# Patient Record
Sex: Male | Born: 1940 | Race: White | Hispanic: No | Marital: Married | Smoking: Never smoker
Health system: Southern US, Community
[De-identification: ages and names within clinical notes are randomized; demographics above are authoritative.]

## PROBLEM LIST (undated history)

## (undated) DIAGNOSIS — N400 Enlarged prostate without lower urinary tract symptoms: Secondary | ICD-10-CM

## (undated) DIAGNOSIS — I10 Essential (primary) hypertension: Secondary | ICD-10-CM

## (undated) HISTORY — PX: SHOULDER ARTHROSCOPY: SHX128

---

## 2018-06-23 ENCOUNTER — Inpatient Hospital Stay (HOSPITAL_BASED_OUTPATIENT_CLINIC_OR_DEPARTMENT_OTHER)
Admission: EM | Admit: 2018-06-23 | Discharge: 2018-06-26 | DRG: 352 | Disposition: A | Discharge status: Home / Self Care | Payer: Medicare Other | Attending: General Surgery | Admitting: General Surgery

## 2018-06-23 ENCOUNTER — Emergency Department (HOSPITAL_BASED_OUTPATIENT_CLINIC_OR_DEPARTMENT_OTHER): Payer: Medicare Other

## 2018-06-23 ENCOUNTER — Inpatient Hospital Stay (HOSPITAL_COMMUNITY): Payer: Medicare Other

## 2018-06-23 ENCOUNTER — Other Ambulatory Visit: Payer: Self-pay

## 2018-06-23 ENCOUNTER — Encounter (HOSPITAL_BASED_OUTPATIENT_CLINIC_OR_DEPARTMENT_OTHER): Payer: Self-pay | Admitting: *Deleted

## 2018-06-23 DIAGNOSIS — N5089 Other specified disorders of the male genital organs: Secondary | ICD-10-CM | POA: Diagnosis present

## 2018-06-23 DIAGNOSIS — Z880 Allergy status to penicillin: Secondary | ICD-10-CM

## 2018-06-23 DIAGNOSIS — Z0189 Encounter for other specified special examinations: Secondary | ICD-10-CM

## 2018-06-23 DIAGNOSIS — N400 Enlarged prostate without lower urinary tract symptoms: Secondary | ICD-10-CM | POA: Diagnosis present

## 2018-06-23 DIAGNOSIS — Z79899 Other long term (current) drug therapy: Secondary | ICD-10-CM | POA: Diagnosis not present

## 2018-06-23 DIAGNOSIS — I10 Essential (primary) hypertension: Secondary | ICD-10-CM | POA: Diagnosis present

## 2018-06-23 DIAGNOSIS — K56609 Unspecified intestinal obstruction, unspecified as to partial versus complete obstruction: Secondary | ICD-10-CM

## 2018-06-23 DIAGNOSIS — K4030 Unilateral inguinal hernia, with obstruction, without gangrene, not specified as recurrent: Secondary | ICD-10-CM | POA: Diagnosis present

## 2018-06-23 HISTORY — DX: Benign prostatic hyperplasia without lower urinary tract symptoms: N40.0

## 2018-06-23 HISTORY — DX: Essential (primary) hypertension: I10

## 2018-06-23 LAB — COMPREHENSIVE METABOLIC PANEL
ALK PHOS: 57 U/L (ref 38–126)
ALT: 22 U/L (ref 0–44)
ANION GAP: 7 (ref 5–15)
AST: 21 U/L (ref 15–41)
Albumin: 4 g/dL (ref 3.5–5.0)
BUN: 21 mg/dL (ref 8–23)
CALCIUM: 8.6 mg/dL — AB (ref 8.9–10.3)
CO2: 25 mmol/L (ref 22–32)
Chloride: 104 mmol/L (ref 98–111)
Creatinine, Ser: 0.93 mg/dL (ref 0.61–1.24)
GFR calc non Af Amer: >60 mL/min (ref >60)
Glucose, Bld: 138 mg/dL — ABNORMAL HIGH (ref 70–99)
Potassium: 4 mmol/L (ref 3.5–5.1)
SODIUM: 136 mmol/L (ref 135–145)
Total Bilirubin: 1.6 mg/dL — ABNORMAL HIGH (ref 0.3–1.2)
Total Protein: 6.4 g/dL — ABNORMAL LOW (ref 6.5–8.1)

## 2018-06-23 LAB — URINALYSIS, MICROSCOPIC (REFLEX)

## 2018-06-23 LAB — CBC WITH DIFFERENTIAL/PLATELET
BASOS PCT: 0 %
Basophils Absolute: 0 10*3/uL (ref 0.0–0.1)
Eosinophils Absolute: 0 10*3/uL (ref 0.0–0.7)
Eosinophils Relative: 0 %
HCT: 44.1 % (ref 39.0–52.0)
HEMOGLOBIN: 15.9 g/dL (ref 13.0–17.0)
Lymphocytes Relative: 9 %
Lymphs Abs: 1 10*3/uL (ref 0.7–4.0)
MCH: 32.6 pg (ref 26.0–34.0)
MCHC: 36.1 g/dL — AB (ref 30.0–36.0)
MCV: 90.4 fL (ref 78.0–100.0)
MONOS PCT: 5 %
Monocytes Absolute: 0.5 10*3/uL (ref 0.1–1.0)
NEUTROS ABS: 9.4 10*3/uL — AB (ref 1.7–7.7)
NEUTROS PCT: 86 %
Platelets: 163 10*3/uL (ref 150–400)
RBC: 4.88 MIL/uL (ref 4.22–5.81)
RDW: 13 % (ref 11.5–15.5)
WBC: 11 10*3/uL — ABNORMAL HIGH (ref 4.0–10.5)

## 2018-06-23 LAB — URINALYSIS, ROUTINE W REFLEX MICROSCOPIC
Bilirubin Urine: NEGATIVE
Glucose, UA: NEGATIVE mg/dL
Ketones, ur: 15 mg/dL — AB
Leukocytes, UA: NEGATIVE
NITRITE: NEGATIVE
PH: 5.5 (ref 5.0–8.0)
Protein, ur: NEGATIVE mg/dL

## 2018-06-23 LAB — LIPASE, BLOOD: LIPASE: 22 U/L (ref 11–51)

## 2018-06-23 MED ORDER — LIDOCAINE HCL URETHRAL/MUCOSAL 2 % EX GEL
CUTANEOUS | Status: AC
  Filled 2018-06-23: qty 20

## 2018-06-23 MED ORDER — ONDANSETRON HCL 4 MG/2ML IJ SOLN
4.0000 mg | Freq: Four times a day (QID) | INTRAMUSCULAR | Status: DC | PRN
  Administered 2018-06-23 – 2018-06-25 (×2): 4 mg via INTRAVENOUS
  Filled 2018-06-23 (×2): qty 2

## 2018-06-23 MED ORDER — MORPHINE SULFATE (PF) 2 MG/ML IV SOLN
2.0000 mg | INTRAVENOUS | Status: DC | PRN
  Administered 2018-06-23 (×3): 2 mg via INTRAVENOUS
  Filled 2018-06-23 (×3): qty 1

## 2018-06-23 MED ORDER — LIDOCAINE HCL URETHRAL/MUCOSAL 2 % EX GEL: 1.0000 "application " | Freq: Once | CUTANEOUS | Status: DC

## 2018-06-23 MED ORDER — IOPAMIDOL (ISOVUE-300) INJECTION 61%
100.0000 mL | Freq: Once | INTRAVENOUS | Status: AC
  Administered 2018-06-23: 100 mL via INTRAVENOUS

## 2018-06-23 MED ORDER — ONDANSETRON HCL 4 MG/2ML IJ SOLN
4.0000 mg | Freq: Once | INTRAMUSCULAR | Status: AC
  Administered 2018-06-23: 4 mg via INTRAVENOUS
  Filled 2018-06-23: qty 2

## 2018-06-23 MED ORDER — MORPHINE SULFATE (PF) 4 MG/ML IV SOLN
4.0000 mg | Freq: Once | INTRAVENOUS | Status: AC
  Administered 2018-06-23: 4 mg via INTRAVENOUS
  Filled 2018-06-23: qty 1

## 2018-06-23 MED ORDER — ONDANSETRON HCL 4 MG/2ML IJ SOLN
4.0000 mg | Freq: Four times a day (QID) | INTRAMUSCULAR | Status: DC
  Administered 2018-06-23 (×2): 4 mg via INTRAVENOUS
  Filled 2018-06-23 (×2): qty 2

## 2018-06-23 MED ORDER — ONDANSETRON 4 MG PO TBDP: 4.0000 mg | ORAL_TABLET | Freq: Once | ORAL | Status: DC

## 2018-06-23 MED ORDER — SODIUM CHLORIDE 0.9 % IV BOLUS
1000.0000 mL | Freq: Once | INTRAVENOUS | Status: AC
  Administered 2018-06-23: 1000 mL via INTRAVENOUS

## 2018-06-23 MED ORDER — DIATRIZOATE MEGLUMINE & SODIUM 66-10 % PO SOLN
90.0000 mL | Freq: Once | ORAL | Status: AC
  Administered 2018-06-23: 90 mL via NASOGASTRIC
  Filled 2018-06-23: qty 90

## 2018-06-23 MED ORDER — ENOXAPARIN SODIUM 40 MG/0.4ML ~~LOC~~ SOLN
40.0000 mg | SUBCUTANEOUS | Status: DC
  Administered 2018-06-24: 40 mg via SUBCUTANEOUS
  Filled 2018-06-23: qty 0.4

## 2018-06-23 MED ORDER — POTASSIUM CHLORIDE IN NACL 20-0.9 MEQ/L-% IV SOLN
INTRAVENOUS | Status: DC
  Administered 2018-06-23 – 2018-06-25 (×5): via INTRAVENOUS
  Filled 2018-06-23 (×8): qty 1000

## 2018-06-23 NOTE — ED Provider Notes (Signed)
[facility] EMERGENCY DEPARTMENT Provider Note   CSN: [id number] Arrival date & time: 06/23/18  0117     History   Chief Complaint Chief Complaint  Patient presents with  . Abdominal Pain    HPI Journey Ratterman is a 77 y.o. male.  Patient is a 77 year old male with past medical history of hypertension and BPH.  He presents today for evaluation of abdominal pain.  Earlier this evening he began with discomfort in the umbilical region.  He felt he might be constipated and tried taking some laxatives with no relief.  He did experience one episode of vomiting this evening, but no diarrhea.  He denies any fevers or chills.  He also complains of a swollen right testicle which she just noticed earlier this evening.  The history is provided by the patient.  Abdominal Pain   This is a new problem. Episode onset: this evening. The problem occurs constantly. The problem has been rapidly worsening. The pain is associated with an unknown factor. The pain is located in the periumbilical region. The pain is moderate. Associated symptoms include nausea and vomiting. Pertinent negatives include fever and diarrhea. The symptoms are aggravated by certain positions and palpation. Nothing relieves the symptoms.    Past Medical History:  Diagnosis Date  . Hypertension    borderline   . Prostate enlargement     There are no active problems to display for this patient.   Past Surgical History:  Procedure Laterality Date  . SHOULDER ARTHROSCOPY          Home Medications    Prior to Admission medications   Medication Sig Start Date End Date Taking? Authorizing Provider  dutasteride (AVODART) 0.5 MG capsule Take 0.5 mg by mouth daily.   Yes [provider]  glucosamine-chondroitin 500-400 MG tablet Take 1 tablet by mouth 3 (three) times daily.   Yes [provider]  tamsulosin (FLOMAX) 0.4 MG CAPS capsule Take 0.4 mg by mouth.   Yes [provider]    TraZODone HCl 150 MG TB24 Take by mouth.   Yes [provider]    Family History No family history on file.  Social History Social History   Tobacco Use  . Smoking status: Never Smoker  . Smokeless tobacco: Never Used  Substance Use Topics  . Alcohol use: Yes    Comment: occasional   . Drug use: Not on file     Allergies   Penicillins   Review of Systems Review of Systems  Constitutional: Negative for fever.  Gastrointestinal: Positive for abdominal pain, nausea and vomiting. Negative for diarrhea.  All other systems reviewed and are negative.    Physical Exam Updated Vital Signs BP (!) 166/82   Pulse 72   Temp 98.4 F (36.9 C)   Resp 20   Ht 5\' 10"  (1.778 m)   Wt 72.1 kg   SpO2 100%   BMI 22.81 kg/m   Physical Exam  Constitutional: He is oriented to person, place, and time. He appears well-developed and well-nourished. No distress.  HENT:  Head: Normocephalic and atraumatic.  Mouth/Throat: Oropharynx is clear and moist.  Neck: Normal range of motion. Neck supple.  Cardiovascular: Normal rate and regular rhythm. Exam reveals no friction rub.  No murmur heard. Pulmonary/Chest: Effort normal and breath sounds normal. No respiratory distress. He has no wheezes. He has no rales.  Abdominal: Soft. Bowel sounds are normal. He exhibits no distension. There is tenderness in the periumbilical area. There is no  rigidity, no rebound and no guarding.  Genitourinary: Right testis shows swelling.  Genitourinary Comments: The right testicle is swollen, but freely movable within the scrotum.  There is minimal tenderness to palpation.  Musculoskeletal: Normal range of motion. He exhibits no edema.  Neurological: He is alert and oriented to person, place, and time. Coordination normal.  Skin: Skin is warm and dry. He is not diaphoretic.  Nursing note and vitals reviewed.    ED Treatments / Results  Labs (all labs ordered are listed, but only abnormal results  are displayed) Labs Reviewed  LIPASE, BLOOD  COMPREHENSIVE METABOLIC PANEL  CBC WITH DIFFERENTIAL/PLATELET  URINALYSIS, ROUTINE W REFLEX MICROSCOPIC    EKG None  Radiology No results found.  Procedures Procedures (including critical care time)  Medications Ordered in ED Medications  sodium chloride 0.9 % bolus 1,000 mL (has no administration in time range)  ondansetron (ZOFRAN) injection 4 mg (has no administration in time range)     Initial Impression / Assessment and Plan / ED Course  I have reviewed the triage vital signs and the nursing notes.  Pertinent labs & imaging results that were available during my care of the patient were reviewed by me and considered in my medical decision making (see chart for details).  Patient's work-up reveals a small bowel obstruction.  I have discussed this with Dr. Johna SheriffHoxworth from general surgery.  He agrees to accept the patient in transfer.  He will go [facility] ER for surgical consultation.  An NG tube will be placed.  Final Clinical Impressions(s) / ED Diagnoses   Final diagnoses:  None    ED Discharge Orders    None       Geoffery Lyonselo, Dannah Ryles, MD 06/23/18 [phone]

## 2018-06-23 NOTE — Progress Notes (Signed)
Elevated b/p noted- pt is having increased pain at the moment, rechecked manually 184/82

## 2018-06-23 NOTE — Progress Notes (Signed)
Pt arrived to 1511 via [facility]. No c/o pain or nausea. NGT patent and on low intermittent wall suction. Pt orient to room. No questions. VSS. MD paged of pt arrival and orders placed. Justin Mendaudle, Al Bracewell H, RN

## 2018-06-23 NOTE — H&P (Signed)
[facility] Surgery Consult/Admission Note  Kenneth Barnett July 25, 1941  [id number].    Requesting MD: Dr. Stark Jock Chief Complaint/Reason for Consult: SBO  HPI:   Patient is a 77 year old male with a history of enlarged prostate who presented to the emergency department with complaints of abdominal pain, nausea and vomiting. Patient states he is in town shopping for furniture. He does not live here. He states yesterday afternoon he was shopping with his wife and began having diaphoresis, abdominal pain, and nausea. He does not believe he had a bowel movement on Saturday or Sunday morning and he is usually regular every day. His abdominal pain was intermittent throughout the day, crampy in nature, severe at times, and at times radiated into his scrotum. Patient tried stool softener and a suppository and had a small BM last night. Pain progressively worsened which prompted him to come to the ER. Patient states he noticed swelling in his right scrotum last night which has since resolved. He does not recall having issues with scrotal swelling in the past. He thinks he may have had a hernia in high school/college. He has a hx of a right testicular cyst but has not had any issues with swelling prior to last night. Patient states 2 episodes of emesis. No additional flatus or BM since arrival to the hospital. Patient denies any previous abdominal surgeries. Patient is not anticoagulated.  ROS:  Review of Systems  Constitutional: Positive for diaphoresis. Negative for chills and fever.  HENT: Negative for sore throat.   Respiratory: Negative for cough and shortness of breath.   Cardiovascular: Negative for chest pain.  Gastrointestinal: Positive for abdominal pain, constipation, nausea and vomiting. Negative for blood in stool and diarrhea.  Genitourinary: Negative for dysuria.       + for R scrotal swelling  Skin: Negative for rash.  Neurological: Positive for dizziness. Negative for sensory change,  focal weakness and loss of consciousness.  All other systems reviewed and are negative.    No family history on file.  Past Medical History:  Diagnosis Date  . Hypertension    borderline   . Prostate enlargement     Past Surgical History:  Procedure Laterality Date  . SHOULDER ARTHROSCOPY      Social History:  reports that he has never smoked. He has never used smokeless tobacco. He reports that he drinks alcohol. His drug history is not on file.  Allergies:  Allergies  Allergen Reactions  . Penicillins Rash    Has patient had a PCN reaction causing immediate rash, facial/tongue/throat swelling, SOB or lightheadedness with hypotension: Yes Has patient had a PCN reaction causing severe rash involving mucus membranes or skin necrosis: No Has patient had a PCN reaction that required hospitalization: No Has patient had a PCN reaction occurring within the last 10 years: No If all of the above answers are "NO", then may proceed with Cephalosporin use.  . Tape Rash    Paper tape is ok    Medications Prior to Admission  Medication Sig Dispense Refill  . Cyanocobalamin (VITAMIN B-12) 1000 MCG SUBL Place 1,000 mcg under the tongue daily.    Marland Kitchen dutasteride (AVODART) 0.5 MG capsule Take 0.5 mg by mouth daily.    Marland Kitchen GLUCOSAMINE-CHONDROITIN PO Take 1 tablet by mouth daily. Glucosamine 1500 mg-Chondroitin 1200 mg tablet    . Probiotic Product (PROBIOTIC PO) Take 1 capsule by mouth daily.    . tamsulosin (FLOMAX) 0.4 MG CAPS capsule Take 0.4 mg by mouth daily.     Marland Kitchen  TraZODone HCl 150 MG TB24 Take 150 mg by mouth at bedtime.     . TURMERIC PO Place 500 mg under the tongue daily.      Blood pressure (!) 159/77, pulse 66, temperature 98.6 F (37 C), temperature source Oral, resp. rate 20, height '5\' 10"'$  (1.778 m), weight 72.1 kg, SpO2 98 %.  Physical Exam  Constitutional: He is oriented to person, place, and time. He appears well-developed and well-nourished.  Non-toxic appearance. He does  not appear ill. No distress.  Appears younger than stated age  HENT:  Head: Normocephalic and atraumatic.  Nose: Nose normal.  Mouth/Throat: Oropharynx is clear and moist and mucous membranes are normal.  Eyes: Pupils are equal, round, and reactive to light. Conjunctivae are normal. Right eye exhibits no discharge. Left eye exhibits no discharge. No scleral icterus.  Neck: Normal range of motion. Neck supple. No thyromegaly present.  Cardiovascular: Normal rate, regular rhythm, normal heart sounds and intact distal pulses.  No murmur heard. Pulses:      Dorsalis pedis pulses are 2+ on the right side, and 2+ on the left side.  Pulmonary/Chest: Effort normal and breath sounds normal. No respiratory distress. He has no wheezes. He has no rhonchi. He has no rales.  Abdominal: Soft. Normal appearance and bowel sounds are normal. He exhibits no distension. There is no hepatosplenomegaly. There is tenderness in the periumbilical area. There is no rigidity and no guarding.    Genitourinary: Penis normal. Right testis shows mass (pt states is a cyst). Right testis shows no swelling and no tenderness. Left testis shows no mass, no swelling and no tenderness.  Genitourinary Comments: Did not appreciate a hernia  Musculoskeletal: Normal range of motion. He exhibits no edema, tenderness or deformity.  Lymphadenopathy:    He has no cervical adenopathy.  Neurological: He is alert and oriented to person, place, and time.  Skin: Skin is warm and dry. No rash noted. He is not diaphoretic.  Psychiatric: He has a normal mood and affect.  Nursing note and vitals reviewed.   Results for orders placed or performed during the hospital encounter of 06/23/18 (from the past 48 hour(s))  Lipase, blood     Status: None   Collection Time: 06/23/18  2:25 AM  Result Value Ref Range   Lipase 22 11 - 51 U/L    Comment: Performed at [facility], [address] [place], [place] [address]  Comprehensive  metabolic panel     Status: Abnormal   Collection Time: 06/23/18  2:25 AM  Result Value Ref Range   Sodium 136 135 - 145 mmol/L   Potassium 4.0 3.5 - 5.1 mmol/L   Chloride 104 98 - 111 mmol/L   CO2 25 22 - 32 mmol/L   Glucose, Bld 138 (H) 70 - 99 mg/dL   BUN 21 8 - 23 mg/dL   Creatinine, Ser 0.93 0.61 - 1.24 mg/dL   Calcium 8.6 (L) 8.9 - 10.3 mg/dL   Total Protein 6.4 (L) 6.5 - 8.1 g/dL   Albumin 4.0 3.5 - 5.0 g/dL   AST 21 15 - 41 U/L   ALT 22 0 - 44 U/L   Alkaline Phosphatase 57 38 - 126 U/L   Total Bilirubin 1.6 (H) 0.3 - 1.2 mg/dL   GFR calc non Af Amer >60 >60 mL/min   GFR calc Af Amer >60 >60 mL/min    Comment: (NOTE) The eGFR has been calculated using the CKD EPI equation. This calculation  has not been validated in all clinical situations. eGFR's persistently <60 mL/min signify possible Chronic Kidney Disease.    Anion gap 7 5 - 15    Comment: Performed at [facility], [address] [place], [place] [address]  CBC with Differential     Status: Abnormal   Collection Time: 06/23/18  2:25 AM  Result Value Ref Range   WBC 11.0 (H) 4.0 - 10.5 K/uL   RBC 4.88 4.22 - 5.81 MIL/uL   Hemoglobin 15.9 13.0 - 17.0 g/dL   HCT 44.1 39.0 - 52.0 %   MCV 90.4 78.0 - 100.0 fL   MCH 32.6 26.0 - 34.0 pg   MCHC 36.1 (H) 30.0 - 36.0 g/dL   RDW 13.0 11.5 - 15.5 %   Platelets 163 150 - 400 K/uL   Neutrophils Relative % 86 %   Neutro Abs 9.4 (H) 1.7 - 7.7 K/uL   Lymphocytes Relative 9 %   Lymphs Abs 1.0 0.7 - 4.0 K/uL   Monocytes Relative 5 %   Monocytes Absolute 0.5 0.1 - 1.0 K/uL   Eosinophils Relative 0 %   Eosinophils Absolute 0.0 0.0 - 0.7 K/uL   Basophils Relative 0 %   Basophils Absolute 0.0 0.0 - 0.1 K/uL    Comment: Performed at [facility], [address] [place], [place] [address]  Urinalysis, Routine w reflex microscopic     Status: Abnormal   Collection Time: 06/23/18  2:25 AM  Result Value Ref Range   Color, Urine YELLOW YELLOW   APPearance  CLEAR CLEAR   Specific Gravity, Urine >1.030 (H) 1.005 - 1.030   pH 5.5 5.0 - 8.0   Glucose, UA NEGATIVE NEGATIVE mg/dL   Hgb urine dipstick TRACE (A) NEGATIVE   Bilirubin Urine NEGATIVE NEGATIVE   Ketones, ur 15 (A) NEGATIVE mg/dL   Protein, ur NEGATIVE NEGATIVE mg/dL   Nitrite NEGATIVE NEGATIVE   Leukocytes, UA NEGATIVE NEGATIVE    Comment: Performed at [facility], [address] [place], [place] [address]  Urinalysis, Microscopic (reflex)     Status: Abnormal   Collection Time: 06/23/18  2:25 AM  Result Value Ref Range   RBC / HPF 0-5 0 - 5 RBC/hpf   WBC, UA 0-5 0 - 5 WBC/hpf   Bacteria, UA FEW (A) NONE SEEN   Squamous Epithelial / LPF 0-5 0 - 5   Mucus PRESENT    Sperm, UA PRESENT     Comment: Performed at [facility], [address] [place], [place] [address]   Ct Abdomen Pelvis W Contrast  Result Date: 06/23/2018 CLINICAL DATA:  77 y/o M; nausea, vomiting, bloating, constipation for 1 day. EXAM: CT ABDOMEN AND PELVIS WITH CONTRAST TECHNIQUE: Multidetector CT imaging of the abdomen and pelvis was performed using the standard protocol following bolus administration of intravenous contrast. CONTRAST:  141m ISOVUE-300 IOPAMIDOL (ISOVUE-300) INJECTION 61% COMPARISON:  None. FINDINGS: Lower chest: No acute abnormality. Hepatobiliary: No focal liver abnormality is seen. No gallstones, gallbladder wall thickening, or biliary dilatation. Pancreas: Unremarkable. No pancreatic ductal dilatation or surrounding inflammatory changes. Spleen: Normal in size without focal abnormality. Adrenals/Urinary Tract: Adrenal glands are unremarkable. Right kidney interpolar subcentimeter cyst. Kidneys are otherwise normal, without renal calculi, additional focal lesion, or hydronephrosis. Right posterior bladder calcification may represent a wall calcification or bladder stones. Stomach/Bowel: Dilated loops of small bowel in the left hemiabdomen a transition in the lower mid abdomen  (series 2,  image 47). Small volume of ascites. No findings of perforation or abscess. Normal appearance of the stomach. No inflammatory changes of the colon. Normal appendix. Vascular/Lymphatic: Aortic atherosclerosis. No enlarged abdominal or pelvic lymph nodes. Reproductive: Severe prostate enlargement measuring 5.7 x 6.5 x 7.8 cm (volume = 150 cm^3). Other: No abdominal wall hernia or abnormality. No abdominopelvic ascites. Musculoskeletal: No fracture is seen. Lumbar spondylosis with L5-S1 loss of intervertebral disc space height and lower lumbar facet arthrosis. IMPRESSION: 1. Dilated loops of small bowel in the left hemiabdomen compatible small bowel obstruction. Transition in bowel caliber in the lower mid abdomen. No findings of perforation. 2. Small volume of ascites. 3.  Aortic Atherosclerosis (ICD10-I70.0). 4. Severe prostate enlargement, proximally 150 cc. Electronically Signed   By: Kristine Garbe M.D.   On: 06/23/2018 04:14      Assessment/Plan Active Problems:   Small bowel obstruction (HCC)  SBO - no hx of abd surgeries - pt is feeling better since admission, no NGT output since 0700  R Scrotal swelling - resolved - this could have been a hernia that caused the patient's symptoms and spontaneously reduced - If it returns will obtain a [place]  FEN: NPO, NGT VTE: SCD's, lovenox ID: none Foley: none Follow up: TBD  Plan: small bowel protocol with gastrografin   Kalman Drape, [facility] 06/23/2018, 9:24 AM Pager: [phone] Consults: [phone] Mon-Fri 7:00 am-4:30 pm Sat-Sun 7:00 am-11:30 am

## 2018-06-23 NOTE — ED Notes (Signed)
ED Provider at bedside. 

## 2018-06-23 NOTE — ED Triage Notes (Signed)
Pt states that he took a dulcolax suppository because he felt constipated. States he feels like something is "blocked" states he did have a small bowel movement. States he also took a stool softener at bedtime. C/o feeling nauseated and vomited times 1. States pain comes and goes. Describes as sharp.

## 2018-06-23 NOTE — ED Notes (Signed)
Patient transported to CT 

## 2018-06-23 NOTE — ED Notes (Signed)
Vomited x 1, pt stated he feels better afterward.

## 2018-06-24 ENCOUNTER — Inpatient Hospital Stay (HOSPITAL_COMMUNITY): Payer: Medicare Other

## 2018-06-24 MED ORDER — DUTASTERIDE 0.5 MG PO CAPS
0.5000 mg | ORAL_CAPSULE | Freq: Every day | ORAL | Status: DC
  Administered 2018-06-24 – 2018-06-26 (×2): 0.5 mg via ORAL
  Filled 2018-06-24 (×3): qty 1

## 2018-06-24 MED ORDER — DOCUSATE SODIUM 100 MG PO CAPS: 100.0000 mg | ORAL_CAPSULE | Freq: Every day | ORAL | Status: DC

## 2018-06-24 MED ORDER — TAMSULOSIN HCL 0.4 MG PO CAPS
0.4000 mg | ORAL_CAPSULE | Freq: Every day | ORAL | Status: DC
  Administered 2018-06-24 – 2018-06-26 (×2): 0.4 mg via ORAL
  Filled 2018-06-24 (×2): qty 1

## 2018-06-24 MED ORDER — POLYETHYLENE GLYCOL 3350 17 G PO PACK: 17.0000 g | PACK | Freq: Every day | ORAL | Status: DC

## 2018-06-24 NOTE — Plan of Care (Signed)
  Problem: Nutrition: Goal: Adequate nutrition will be maintained Outcome: Progressing   Problem: Pain Managment: Goal: General experience of comfort will improve Outcome: Progressing   Problem: Safety: Goal: Ability to remain free from injury will improve Outcome: Progressing   

## 2018-06-24 NOTE — Progress Notes (Signed)
[facility] Progress Note      Assessment/Plan SBO - no hx of abd surgeries - pt is feeling better since admission, NGT output around 500 in 24 hours - abd xray looks like contrast in colon, awaiting radiology read  R Scrotal swelling - resolved - this could have been a hernia that caused the patient's symptoms and spontaneously reduced - If it returns will obtain a [place]  FEN: clamp NGT, clears VTE: SCD's, lovenox ID: none Foley: none Follow up: TBD  Plan: clamp NGT, allow clears, when pt has bowel function will pull NGT   LOS: 1 day    Subjective: CC: SBO  Pt is feeling better. No BM or flatus yet. Discussed possibility of incarcerated RIH that reduced spontaneously. No family at bedside. No issues overnight.   Objective: Vital signs in last 24 hours: Temp:  [98.3 F (36.8 C)-98.8 F (37.1 C)] 98.7 F (37.1 C) (09/24 0457) Pulse Rate:  [65-69] 65 (09/24 0457) Resp:  [16-20] 19 (09/24 0457) BP: (141-200)/(68-90) 141/68 (09/24 0457) SpO2:  [95 %-98 %] 95 % (09/24 0457) Last BM Date: 06/20/18  Intake/Output from previous day: 09/23 0701 - 09/24 0700 In: 876.7 [I.V.:876.7] Out: -  Intake/Output this shift: No intake/output data recorded.  PE: Gen:  Alert, NAD, pleasant, cooperative HEENT: NGT in place Card:  RRR, no M/G/R heard Pulm:  CTA, no W/R/R, rate and effort normal Abd: Soft, NT/ND, +BS, no HSM Skin: no rashes noted, warm and dry   Anti-infectives: Anti-infectives (From admission, onward)   None      Lab Results:  Recent Labs    06/23/18 0225  WBC 11.0*  HGB 15.9  HCT 44.1  PLT 163   BMET Recent Labs    06/23/18 0225  NA 136  K 4.0  CL 104  CO2 25  GLUCOSE 138*  BUN 21  CREATININE 0.93  CALCIUM 8.6*   PT/INR No results for input(s): LABPROT, INR in the last 72 hours. CMP     Component Value Date/Time   NA 136 06/23/2018 0225   K 4.0 06/23/2018 0225   CL 104 06/23/2018 0225   CO2 25 06/23/2018 0225    GLUCOSE 138 (H) 06/23/2018 0225   BUN 21 06/23/2018 0225   CREATININE 0.93 06/23/2018 0225   CALCIUM 8.6 (L) 06/23/2018 0225   PROT 6.4 (L) 06/23/2018 0225   ALBUMIN 4.0 06/23/2018 0225   AST 21 06/23/2018 0225   ALT 22 06/23/2018 0225   ALKPHOS 57 06/23/2018 0225   BILITOT 1.6 (H) 06/23/2018 0225   GFRNONAA >60 06/23/2018 0225   GFRAA >60 06/23/2018 0225   Lipase     Component Value Date/Time   LIPASE 22 06/23/2018 0225    Studies/Results: Ct Abdomen Pelvis W Contrast  Result Date: 06/23/2018 CLINICAL DATA:  77 y/o M; nausea, vomiting, bloating, constipation for 1 day. EXAM: CT ABDOMEN AND PELVIS WITH CONTRAST TECHNIQUE: Multidetector CT imaging of the abdomen and pelvis was performed using the standard protocol following bolus administration of intravenous contrast. CONTRAST:  100mL ISOVUE-300 IOPAMIDOL (ISOVUE-300) INJECTION 61% COMPARISON:  None. FINDINGS: Lower chest: No acute abnormality. Hepatobiliary: No focal liver abnormality is seen. No gallstones, gallbladder wall thickening, or biliary dilatation. Pancreas: Unremarkable. No pancreatic ductal dilatation or surrounding inflammatory changes. Spleen: Normal in size without focal abnormality. Adrenals/Urinary Tract: Adrenal glands are unremarkable. Right kidney interpolar subcentimeter cyst. Kidneys are otherwise normal, without renal calculi, additional focal lesion, or hydronephrosis. Right posterior bladder calcification may represent a wall calcification  or bladder stones. Stomach/Bowel: Dilated loops of small bowel in the left hemiabdomen a transition in the lower mid abdomen (series 2, image 47). Small volume of ascites. No findings of perforation or abscess. Normal appearance of the stomach. No inflammatory changes of the colon. Normal appendix. Vascular/Lymphatic: Aortic atherosclerosis. No enlarged abdominal or pelvic lymph nodes. Reproductive: Severe prostate enlargement measuring 5.7 x 6.5 x 7.8 cm (volume = 150 cm^3).  Other: No abdominal wall hernia or abnormality. No abdominopelvic ascites. Musculoskeletal: No fracture is seen. Lumbar spondylosis with L5-S1 loss of intervertebral disc space height and lower lumbar facet arthrosis. IMPRESSION: 1. Dilated loops of small bowel in the left hemiabdomen compatible small bowel obstruction. Transition in bowel caliber in the lower mid abdomen. No findings of perforation. 2. Small volume of ascites. 3.  Aortic Atherosclerosis (ICD10-I70.0). 4. Severe prostate enlargement, proximally 150 cc. Electronically Signed   By: Mitzi Hansen M.D.   On: 06/23/2018 04:14   Dg Abd Portable 1v-small Bowel Obstruction Protocol-initial, 8 Hr Delay  Result Date: 06/23/2018 CLINICAL DATA:  Small-bowel obstruction. 8 hour delay following contrast administration. EXAM: PORTABLE ABDOMEN - 1 VIEW COMPARISON:  None. FINDINGS: There are multiple dilated loops of small bowel again visualized throughout the abdomen. Enteric contrast remains concentrated within the stomach and first portion of the duodenum. There is no distal transit of contrast. The enteric tube tip and side port are in the stomach. IMPRESSION: Multiple loops of dilated small bowel with no transit of contrast beyond the duodenum. Findings are consistent with small-bowel obstruction. Electronically Signed   By: Deatra Robinson M.D.   On: 06/23/2018 20:57   Dg Abd Portable 1v-small Bowel Protocol-position Verification  Result Date: 06/23/2018 CLINICAL DATA:  Small bowel obstruction with nasogastric tube placement EXAM: PORTABLE ABDOMEN - 1 VIEW COMPARISON:  CT abdomen and pelvis June 23, 2018 FINDINGS: Nasogastric tube tip and side port in stomach. There remain loops of dilated small bowel without appreciable air-fluid levels. No evident free air. There is contrast in the urinary bladder. Lung bases are clear. IMPRESSION: Nasogastric tube tip and side port in stomach. Bowel dilatation consistent with a degree of bowel  obstruction. No evident free air. Contrast in urinary bladder noted. Electronically Signed   By: Bretta Bang III M.D.   On: 06/23/2018 10:31      Jerre Simon , [facility] 06/24/2018, 9:20 AM  Pager: [phone] Mon-Wed, Friday 7:00am-4:30pm Thurs 7am-11:30am  Consults: [phone]

## 2018-06-25 ENCOUNTER — Inpatient Hospital Stay (HOSPITAL_COMMUNITY): Payer: Medicare Other | Admitting: Anesthesiology

## 2018-06-25 ENCOUNTER — Encounter (HOSPITAL_COMMUNITY): Payer: Self-pay | Admitting: Anesthesiology

## 2018-06-25 ENCOUNTER — Encounter (HOSPITAL_COMMUNITY): Admission: EM | Disposition: A | Discharge status: Home / Self Care | Payer: Self-pay | Source: Home / Self Care

## 2018-06-25 HISTORY — PX: INGUINAL HERNIA REPAIR: SHX194

## 2018-06-25 SURGERY — REPAIR, HERNIA, INGUINAL, ADULT
Anesthesia: General | Site: Groin | Laterality: Right

## 2018-06-25 MED ORDER — FENTANYL CITRATE (PF) 100 MCG/2ML IJ SOLN
INTRAMUSCULAR | Status: AC
  Filled 2018-06-25: qty 2

## 2018-06-25 MED ORDER — EPHEDRINE 5 MG/ML INJ
INTRAVENOUS | Status: AC
  Filled 2018-06-25: qty 10

## 2018-06-25 MED ORDER — METHOCARBAMOL 500 MG PO TABS
750.0000 mg | ORAL_TABLET | Freq: Three times a day (TID) | ORAL | Status: DC
  Administered 2018-06-25 – 2018-06-26 (×3): 750 mg via ORAL
  Filled 2018-06-25 (×3): qty 2

## 2018-06-25 MED ORDER — ROCURONIUM BROMIDE 10 MG/ML (PF) SYRINGE
PREFILLED_SYRINGE | INTRAVENOUS | Status: DC | PRN
  Administered 2018-06-25: 50 mg via INTRAVENOUS
  Administered 2018-06-25: 10 mg via INTRAVENOUS

## 2018-06-25 MED ORDER — CIPROFLOXACIN IN D5W 400 MG/200ML IV SOLN
INTRAVENOUS | Status: AC
  Filled 2018-06-25: qty 200

## 2018-06-25 MED ORDER — KETOROLAC TROMETHAMINE 30 MG/ML IJ SOLN
30.0000 mg | Freq: Three times a day (TID) | INTRAMUSCULAR | Status: DC
  Administered 2018-06-26: 30 mg via INTRAVENOUS
  Filled 2018-06-25 (×2): qty 1

## 2018-06-25 MED ORDER — DEXAMETHASONE SODIUM PHOSPHATE 10 MG/ML IJ SOLN
INTRAMUSCULAR | Status: DC | PRN
  Administered 2018-06-25: 10 mg via INTRAVENOUS

## 2018-06-25 MED ORDER — SCOPOLAMINE 1 MG/3DAYS TD PT72
1.0000 | MEDICATED_PATCH | TRANSDERMAL | Status: DC
  Administered 2018-06-25: 1.5 mg via TRANSDERMAL
  Filled 2018-06-25: qty 1

## 2018-06-25 MED ORDER — MIDAZOLAM HCL 2 MG/2ML IJ SOLN
INTRAMUSCULAR | Status: AC
  Administered 2018-06-25: 2 mg
  Filled 2018-06-25: qty 2

## 2018-06-25 MED ORDER — EPHEDRINE SULFATE-NACL 50-0.9 MG/10ML-% IV SOSY
PREFILLED_SYRINGE | INTRAVENOUS | Status: DC | PRN
  Administered 2018-06-25 (×2): 10 mg via INTRAVENOUS

## 2018-06-25 MED ORDER — FENTANYL CITRATE (PF) 100 MCG/2ML IJ SOLN: 25.0000 ug | INTRAMUSCULAR | Status: DC | PRN

## 2018-06-25 MED ORDER — SODIUM CHLORIDE FLUSH 0.9 % IV SOLN
INTRAVENOUS | Status: AC
  Filled 2018-06-25: qty 10

## 2018-06-25 MED ORDER — LIDOCAINE HCL (PF) 1 % IJ SOLN
INTRAMUSCULAR | Status: AC
  Filled 2018-06-25: qty 30

## 2018-06-25 MED ORDER — ONDANSETRON HCL 4 MG/2ML IJ SOLN
INTRAMUSCULAR | Status: AC
  Filled 2018-06-25: qty 2

## 2018-06-25 MED ORDER — MORPHINE SULFATE (PF) 2 MG/ML IV SOLN: 2.0000 mg | INTRAVENOUS | Status: DC | PRN

## 2018-06-25 MED ORDER — FENTANYL CITRATE (PF) 100 MCG/2ML IJ SOLN
INTRAMUSCULAR | Status: DC | PRN
  Administered 2018-06-25 (×2): 50 ug via INTRAVENOUS

## 2018-06-25 MED ORDER — ENOXAPARIN SODIUM 40 MG/0.4ML ~~LOC~~ SOLN: 40.0000 mg | SUBCUTANEOUS | Status: DC

## 2018-06-25 MED ORDER — BUPIVACAINE-EPINEPHRINE (PF) 0.5% -1:200000 IJ SOLN
INTRAMUSCULAR | Status: DC | PRN
  Administered 2018-06-25: 30 mL via PERINEURAL

## 2018-06-25 MED ORDER — PROPOFOL 10 MG/ML IV BOLUS
INTRAVENOUS | Status: DC | PRN
  Administered 2018-06-25: 200 mg via INTRAVENOUS

## 2018-06-25 MED ORDER — LIDOCAINE 2% (20 MG/ML) 5 ML SYRINGE
INTRAMUSCULAR | Status: AC
  Filled 2018-06-25: qty 10

## 2018-06-25 MED ORDER — ONDANSETRON HCL 4 MG/2ML IJ SOLN: 4.0000 mg | Freq: Once | INTRAMUSCULAR | Status: DC | PRN

## 2018-06-25 MED ORDER — LIDOCAINE HCL 1 % IJ SOLN
INTRAMUSCULAR | Status: DC | PRN
  Administered 2018-06-25: 7 mL via INTRADERMAL

## 2018-06-25 MED ORDER — GABAPENTIN 300 MG PO CAPS
300.0000 mg | ORAL_CAPSULE | ORAL | Status: AC
  Administered 2018-06-25: 300 mg via ORAL
  Filled 2018-06-25: qty 1

## 2018-06-25 MED ORDER — ACETAMINOPHEN 500 MG PO TABS
1000.0000 mg | ORAL_TABLET | Freq: Three times a day (TID) | ORAL | Status: DC
  Administered 2018-06-25 – 2018-06-26 (×3): 1000 mg via ORAL
  Filled 2018-06-25 (×3): qty 2

## 2018-06-25 MED ORDER — SUGAMMADEX SODIUM 200 MG/2ML IV SOLN
INTRAVENOUS | Status: DC | PRN
  Administered 2018-06-25: 200 mg via INTRAVENOUS

## 2018-06-25 MED ORDER — LACTATED RINGERS IV SOLN
Freq: Once | INTRAVENOUS | Status: AC
  Administered 2018-06-25: 11:00:00 via INTRAVENOUS

## 2018-06-25 MED ORDER — SUGAMMADEX SODIUM 200 MG/2ML IV SOLN
INTRAVENOUS | Status: AC
  Filled 2018-06-25: qty 2

## 2018-06-25 MED ORDER — LACTATED RINGERS IV SOLN
INTRAVENOUS | Status: DC | PRN
  Administered 2018-06-25: 12:00:00 via INTRAVENOUS

## 2018-06-25 MED ORDER — BUPIVACAINE LIPOSOME 1.3 % IJ SUSP
20.0000 mL | Freq: Once | INTRAMUSCULAR | Status: AC
  Administered 2018-06-25: 20 mL
  Filled 2018-06-25: qty 20

## 2018-06-25 MED ORDER — ACETAMINOPHEN 500 MG PO TABS
1000.0000 mg | ORAL_TABLET | ORAL | Status: AC
  Administered 2018-06-25: 1000 mg via ORAL
  Filled 2018-06-25: qty 2

## 2018-06-25 MED ORDER — ENSURE PRE-SURGERY PO LIQD
296.0000 mL | Freq: Once | ORAL | Status: AC
  Administered 2018-06-25: 296 mL via ORAL
  Filled 2018-06-25: qty 296

## 2018-06-25 MED ORDER — SCOPOLAMINE 1 MG/3DAYS TD PT72: 1.0000 | MEDICATED_PATCH | TRANSDERMAL | Status: DC

## 2018-06-25 MED ORDER — DEXAMETHASONE SODIUM PHOSPHATE 10 MG/ML IJ SOLN
INTRAMUSCULAR | Status: AC
  Filled 2018-06-25: qty 1

## 2018-06-25 MED ORDER — MIDAZOLAM HCL 2 MG/2ML IJ SOLN: 2.0000 mg | Freq: Once | INTRAMUSCULAR | Status: DC

## 2018-06-25 MED ORDER — ROCURONIUM BROMIDE 10 MG/ML (PF) SYRINGE
PREFILLED_SYRINGE | INTRAVENOUS | Status: AC
  Filled 2018-06-25: qty 10

## 2018-06-25 MED ORDER — FENTANYL CITRATE (PF) 100 MCG/2ML IJ SOLN
50.0000 ug | Freq: Once | INTRAMUSCULAR | Status: AC
  Administered 2018-06-25: 50 ug via INTRAVENOUS

## 2018-06-25 MED ORDER — IBUPROFEN 200 MG PO TABS: 600.0000 mg | ORAL_TABLET | Freq: Four times a day (QID) | ORAL | Status: DC | PRN

## 2018-06-25 MED ORDER — CIPROFLOXACIN IN D5W 400 MG/200ML IV SOLN
400.0000 mg | INTRAVENOUS | Status: AC
  Administered 2018-06-25: 400 mg via INTRAVENOUS
  Filled 2018-06-25: qty 200

## 2018-06-25 MED ORDER — LIDOCAINE 2% (20 MG/ML) 5 ML SYRINGE
INTRAMUSCULAR | Status: DC | PRN
  Administered 2018-06-25: 80 mg via INTRAVENOUS
  Administered 2018-06-25: 20 mg via INTRAVENOUS

## 2018-06-25 MED ORDER — BUPIVACAINE-EPINEPHRINE (PF) 0.25% -1:200000 IJ SOLN
INTRAMUSCULAR | Status: AC
  Filled 2018-06-25: qty 30

## 2018-06-25 MED ORDER — TRAMADOL HCL 50 MG PO TABS
50.0000 mg | ORAL_TABLET | Freq: Four times a day (QID) | ORAL | Status: DC
  Administered 2018-06-25 – 2018-06-26 (×3): 50 mg via ORAL
  Filled 2018-06-25 (×5): qty 1

## 2018-06-25 SURGICAL SUPPLY — 45 items
BENZOIN TINCTURE PRP APPL 2/3 (GAUZE/BANDAGES/DRESSINGS) IMPLANT
BLADE HEX COATED 2.75 (ELECTRODE) ×3 IMPLANT
BLADE SURG 15 STRL LF DISP TIS (BLADE) ×1 IMPLANT
BLADE SURG 15 STRL SS (BLADE) ×2
CLOSURE WOUND 1/2 X4 (GAUZE/BANDAGES/DRESSINGS)
COVER SURGICAL LIGHT HANDLE (MISCELLANEOUS) ×3 IMPLANT
DECANTER SPIKE VIAL GLASS SM (MISCELLANEOUS) ×3 IMPLANT
DERMABOND ADVANCED (GAUZE/BANDAGES/DRESSINGS) ×2
DERMABOND ADVANCED .7 DNX12 (GAUZE/BANDAGES/DRESSINGS) ×1 IMPLANT
DISSECTOR ROUND CHERRY 3/8 STR (MISCELLANEOUS) ×3 IMPLANT
DRAIN PENROSE 18X1/2 LTX STRL (DRAIN) IMPLANT
DRAPE LAPAROSCOPIC ABDOMINAL (DRAPES) ×3 IMPLANT
ELECT PENCIL ROCKER SW 15FT (MISCELLANEOUS) ×3 IMPLANT
ELECT REM PT RETURN 15FT ADLT (MISCELLANEOUS) ×3 IMPLANT
GAUZE SPONGE 4X4 12PLY STRL (GAUZE/BANDAGES/DRESSINGS) ×3 IMPLANT
GLOVE BIO SURGEON STRL SZ 6 (GLOVE) ×6 IMPLANT
GLOVE BIOGEL PI IND STRL 6.5 (GLOVE) ×1 IMPLANT
GLOVE BIOGEL PI IND STRL 7.0 (GLOVE) ×1 IMPLANT
GLOVE BIOGEL PI INDICATOR 6.5 (GLOVE) ×2
GLOVE BIOGEL PI INDICATOR 7.0 (GLOVE) ×2
GLOVE INDICATOR 6.5 STRL GRN (GLOVE) ×6 IMPLANT
GOWN STRL REUS W/ TWL XL LVL3 (GOWN DISPOSABLE) ×3 IMPLANT
GOWN STRL REUS W/TWL 2XL LVL3 (GOWN DISPOSABLE) ×3 IMPLANT
GOWN STRL REUS W/TWL XL LVL3 (GOWN DISPOSABLE) ×6
KIT BASIN OR (CUSTOM PROCEDURE TRAY) ×3 IMPLANT
MESH PARIETEX PROGRIP RIGHT (Mesh General) ×3 IMPLANT
NEEDLE HYPO 25X1 1.5 SAFETY (NEEDLE) ×3 IMPLANT
NS IRRIG 1000ML POUR BTL (IV SOLUTION) ×3 IMPLANT
PACK BASIC VI WITH GOWN DISP (CUSTOM PROCEDURE TRAY) ×3 IMPLANT
SPONGE LAP 18X18 RF (DISPOSABLE) ×3 IMPLANT
STRIP CLOSURE SKIN 1/2X4 (GAUZE/BANDAGES/DRESSINGS) IMPLANT
SUT MNCRL AB 4-0 PS2 18 (SUTURE) ×3 IMPLANT
SUT PROLENE 2 0 SH DA (SUTURE) ×6 IMPLANT
SUT SILK 2 0 SH (SUTURE) IMPLANT
SUT SILK 3 0 (SUTURE)
SUT SILK 3-0 18XBRD TIE 12 (SUTURE) IMPLANT
SUT VIC AB 2-0 SH 27 (SUTURE) ×2
SUT VIC AB 2-0 SH 27X BRD (SUTURE) ×1 IMPLANT
SUT VIC AB 3-0 SH 27 (SUTURE) ×6
SUT VIC AB 3-0 SH 27X BRD (SUTURE) ×1 IMPLANT
SUT VIC AB 3-0 SH 27XBRD (SUTURE) ×2 IMPLANT
SYR BULB IRRIGATION 50ML (SYRINGE) ×3 IMPLANT
SYR CONTROL 10ML LL (SYRINGE) ×3 IMPLANT
TOWEL OR 17X26 10 PK STRL BLUE (TOWEL DISPOSABLE) ×3 IMPLANT
YANKAUER SUCT BULB TIP 10FT TU (MISCELLANEOUS) ×3 IMPLANT

## 2018-06-25 NOTE — Anesthesia Procedure Notes (Signed)
Anesthesia Regional Block: TAP block   Pre-Anesthetic Checklist: ,, timeout performed, Correct Patient, Correct Site, Correct Laterality, Correct Procedure, Correct Position, site marked, Risks and benefits discussed,  Surgical consent,  Pre-op evaluation,  At surgeon's request and post-op pain management  Laterality: Right  Prep: chloraprep       Needles:  Injection technique: Single-shot  Needle Type: Echogenic Needle     Needle Length: 9cm  Needle Gauge: 21     Additional Needles:   Procedures:,,,, ultrasound used (permanent image in chart),,,,  Narrative:  Start time: 06/25/2018 11:37 AM End time: 06/25/2018 11:42 AM Injection made incrementally with aspirations every 5 mL.  Performed by: Personally  Anesthesiologist: Cecile Hearing, MD  Additional Notes: No pain on injection. No increased resistance to injection. Injection made in 5cc increments.  Good needle visualization.  Patient tolerated procedure well.

## 2018-06-25 NOTE — Progress Notes (Signed)
Assisted Dr. Arrie Aran with right side tap block. Side rails up, monitors on throughout procedure. See vital signs in flow sheet. Tolerated Procedure well.

## 2018-06-25 NOTE — Transfer of Care (Signed)
Immediate Anesthesia Transfer of Care Note  Patient: Kenneth Barnett  Procedure(s) Performed: Procedure(s): HERNIA REPAIR INGUINAL ADULT (Right)  Patient Location: PACU  Anesthesia Type:General  Level of Consciousness:  sedated, patient cooperative and responds to stimulation  Airway & Oxygen Therapy:Patient Spontanous Breathing and Patient connected to face mask oxgen  Post-op Assessment:  Report given to PACU RN and Post -op Vital signs reviewed and stable  Post vital signs:  Reviewed and stable  Last Vitals:  Vitals:   06/25/18 1156 06/25/18 1330  BP:    Pulse: (!) 59   Resp: 11   Temp:  (!) (P) 36.3 C  SpO2: 97%     Complications: No apparent anesthesia complications

## 2018-06-25 NOTE — Anesthesia Procedure Notes (Signed)
Procedure Name: Intubation Date/Time: 06/25/2018 12:12 PM Performed by: Lavina Hamman, CRNA Pre-anesthesia Checklist: Patient identified, Emergency Drugs available, Suction available, Patient being monitored and Timeout performed Patient Re-evaluated:Patient Re-evaluated prior to induction Oxygen Delivery Method: Circle system utilized Preoxygenation: Pre-oxygenation with 100% oxygen Induction Type: IV induction Ventilation: Mask ventilation without difficulty Laryngoscope Size: Mac and 4 Grade View: Grade II Tube type: Oral Tube size: 7.5 mm Number of attempts: 1 Airway Equipment and Method: Stylet Placement Confirmation: ETT inserted through vocal cords under direct vision,  positive ETCO2,  CO2 detector and breath sounds checked- equal and bilateral Secured at: 22 cm Tube secured with: Tape Dental Injury: Teeth and Oropharynx as per pre-operative assessment

## 2018-06-25 NOTE — Discharge Instructions (Signed)
[facility] _______[facility] ° °UMBILICAL OR INGUINAL HERNIA REPAIR: POST OP INSTRUCTIONS ° °Always review your discharge instruction sheet given to you by the facility where your surgery was performed. °IF YOU HAVE DISABILITY OR FAMILY LEAVE FORMS, YOU MUST BRING THEM TO THE OFFICE FOR PROCESSING.   °DO NOT GIVE THEM TO YOUR DOCTOR. ° °1. A  prescription for pain medication may be given to you upon discharge.  Take your pain medication as prescribed, if needed.  If narcotic pain medicine is not needed, then you may take acetaminophen (Tylenol) or ibuprofen (Advil) as needed. °2. Take your usually prescribed medications unless otherwise directed. °If you need a refill on your pain medication, please contact your pharmacy.  They will contact our office to request authorization. Prescriptions will not be filled after 5 pm or on week-ends. °3. You should follow a light diet the first 24 hours after arrival home, such as soup and crackers, etc.  Be sure to include lots of fluids daily.  Resume your normal diet the day after surgery. °4.Most patients will experience some swelling and bruising around the umbilicus or in the groin and scrotum.  Ice packs and reclining will help.  Swelling and bruising can take several days to resolve.  °6. It is common to experience some constipation if taking pain medication after surgery.  Increasing fluid intake and taking a stool softener (such as Colace) will usually help or prevent this problem from occurring.  A mild laxative (Milk of Magnesia or Miralax) should be taken according to package directions if there are no bowel movements after 48 hours. °7. Unless discharge instructions indicate otherwise, you may remove your bandages 24-48 hours after surgery, and you may shower at that time.  You may have steri-strips (small skin tapes) in place directly over the incision.  These strips should be left on the skin for 7-10 days.  If your surgeon used skin glue on the  incision, you may shower in 24 hours.  The glue will flake off over the next 2-3 weeks.  Any sutures or staples will be removed at the office during your follow-up visit. °8. ACTIVITIES:  You may resume regular (light) daily activities beginning the next day--such as daily self-care, walking, climbing stairs--gradually increasing activities as tolerated.  You may have sexual intercourse when it is comfortable.  Refrain from any heavy lifting or straining until approved by your doctor. ° °a.You may drive when you are no longer taking prescription pain medication, you can comfortably wear a seatbelt, and you can safely maneuver your car and apply brakes. °b.RETURN TO WORK:   °_____________________________________________ ° °9.You should see your doctor in the office for a follow-up appointment approximately 2-3 weeks after your surgery.  Make sure that you call for this appointment within a day or two after you arrive home to insure a convenient appointment time. °10.OTHER INSTRUCTIONS: _________________________ °   _____________________________________ ° °WHEN TO CALL YOUR DOCTOR: °1. Fever over 101.0 °2. Inability to urinate °3. Nausea and/or vomiting °4. Extreme swelling or bruising °5. Continued bleeding from incision. °6. Increased pain, redness, or drainage from the incision ° °The clinic staff is available to answer your questions during regular business hours.  Please don’t hesitate to call and ask to speak to one of the nurses for clinical concerns.  If you have a medical emergency, go to the nearest emergency room or call 911.  A surgeon from [facility] is always on call at the hospital ° ° °  [address], [address], [place], [place]  [address] ? ° [address] [address], [place], [place]   [address] °[phone] ? [phone] ? FAX [phone] °Web site: [email or web address] °

## 2018-06-25 NOTE — Progress Notes (Signed)
[facility] Progress Note      Assessment/Plan SBO likely 2/2 incarcerated R inguinal hernia - no hx of abd surgeries - resolved, pt having BM's - hernia returned yesterday and pt was able to reduce it - will go to OR today for hernia repair to prevent another episode of incarceration   FEN:NPO VTE: SCD's, lovenox ZO:XWRU Foley:none Follow up:TBD  Plan:NPO, OR today   LOS: 2 days    Subjective: CC: SBO  Pt did not sleep well last night. No nausea or vomiting. Had a few BM's last night. He is agreeable to surgery. No new complaints.   Objective: Vital signs in last 24 hours: Temp:  [98.2 F (36.8 C)-98.5 F (36.9 C)] 98.2 F (36.8 C) (09/25 0417) Pulse Rate:  [59-61] 60 (09/25 0600) Resp:  [18] 18 (09/25 0417) BP: (127-143)/(58-70) 143/70 (09/25 0417) SpO2:  [97 %-98 %] 97 % (09/25 0417) Last BM Date: 06/24/18(per pt)  Intake/Output from previous day: 09/24 0701 - 09/25 0700 In: 979 [P.O.:120; I.V.:859] Out: 0  Intake/Output this shift: Total I/O In: 620.3 [I.V.:620.3] Out: -   PE: Gen:  Alert, NAD, pleasant, cooperative Card:  RRR, no M/G/R heard Pulm:  CTA, no W/R/R, rate and effort normal Abd: Soft, NT/ND, +BS, no HSM Skin: no rashes noted, warm and dry  Anti-infectives: Anti-infectives (From admission, onward)   Start     Dose/Rate Route Frequency Ordered Stop   06/25/18 0830  ciprofloxacin (CIPRO) IVPB 400 mg     400 mg 200 mL/hr over 60 Minutes Intravenous On call to O.R. 06/25/18 0824 06/26/18 0559      Lab Results:  Recent Labs    06/23/18 0225  WBC 11.0*  HGB 15.9  HCT 44.1  PLT 163   BMET Recent Labs    06/23/18 0225  NA 136  K 4.0  CL 104  CO2 25  GLUCOSE 138*  BUN 21  CREATININE 0.93  CALCIUM 8.6*   PT/INR No results for input(s): LABPROT, INR in the last 72 hours. CMP     Component Value Date/Time   NA 136 06/23/2018 0225   K 4.0 06/23/2018 0225   CL 104 06/23/2018 0225   CO2 25  06/23/2018 0225   GLUCOSE 138 (H) 06/23/2018 0225   BUN 21 06/23/2018 0225   CREATININE 0.93 06/23/2018 0225   CALCIUM 8.6 (L) 06/23/2018 0225   PROT 6.4 (L) 06/23/2018 0225   ALBUMIN 4.0 06/23/2018 0225   AST 21 06/23/2018 0225   ALT 22 06/23/2018 0225   ALKPHOS 57 06/23/2018 0225   BILITOT 1.6 (H) 06/23/2018 0225   GFRNONAA >60 06/23/2018 0225   GFRAA >60 06/23/2018 0225   Lipase     Component Value Date/Time   LIPASE 22 06/23/2018 0225    Studies/Results: Dg Abd 1 View  Result Date: 06/24/2018 CLINICAL DATA:  Patient with nausea and abdominal pain. EXAM: ABDOMEN - 1 VIEW COMPARISON:  Abdominal radiograph 06/23/2018 FINDINGS: Enteric tube tip projects over the stomach, side-port projects at the GE junction. Oral contrast material within the colon. Slight interval improving gaseous distended loops of small bowel within the abdomen. Pelvic phleboliths. Lumbar spine degenerative changes. IMPRESSION: Enteric tube side-port projects at the GE junction, tip within the stomach. Consider advancement. Oral contrast material within the colon. Electronically Signed   By: Annia Belt M.D.   On: 06/24/2018 10:36   Dg Abd Portable 1v-small Bowel Obstruction Protocol-initial, 8 Hr Delay  Result Date: 06/23/2018 CLINICAL DATA:  Small-bowel obstruction. 8  hour delay following contrast administration. EXAM: PORTABLE ABDOMEN - 1 VIEW COMPARISON:  None. FINDINGS: There are multiple dilated loops of small bowel again visualized throughout the abdomen. Enteric contrast remains concentrated within the stomach and first portion of the duodenum. There is no distal transit of contrast. The enteric tube tip and side port are in the stomach. IMPRESSION: Multiple loops of dilated small bowel with no transit of contrast beyond the duodenum. Findings are consistent with small-bowel obstruction. Electronically Signed   By: Deatra Robinson M.D.   On: 06/23/2018 20:57   Dg Abd Portable 1v-small Bowel Protocol-position  Verification  Result Date: 06/23/2018 CLINICAL DATA:  Small bowel obstruction with nasogastric tube placement EXAM: PORTABLE ABDOMEN - 1 VIEW COMPARISON:  CT abdomen and pelvis June 23, 2018 FINDINGS: Nasogastric tube tip and side port in stomach. There remain loops of dilated small bowel without appreciable air-fluid levels. No evident free air. There is contrast in the urinary bladder. Lung bases are clear. IMPRESSION: Nasogastric tube tip and side port in stomach. Bowel dilatation consistent with a degree of bowel obstruction. No evident free air. Contrast in urinary bladder noted. Electronically Signed   By: Bretta Bang III M.D.   On: 06/23/2018 10:31      Jerre Simon , [facility] 06/25/2018, 8:26 AM  Pager: [phone] Mon-Wed, Friday 7:00am-4:30pm Thurs 7am-11:30am  Consults: [phone]

## 2018-06-25 NOTE — Anesthesia Preprocedure Evaluation (Addendum)
Anesthesia Evaluation  Patient identified by MRN, date of birth, ID band Patient awake    Reviewed: Allergy & Precautions, NPO status , Patient's Chart, lab work & pertinent test results  Airway Mallampati: II  TM Distance: <3 FB Neck ROM: Full    Dental  (+) Teeth Intact, Dental Advisory Given   Pulmonary sleep apnea and Continuous Positive Airway Pressure Ventilation ,    Pulmonary exam normal breath sounds clear to auscultation       Cardiovascular hypertension, Normal cardiovascular exam Rhythm:Regular Rate:Normal     Neuro/Psych negative neurological ROS  negative psych ROS   GI/Hepatic Neg liver ROS,  inguinal hernia, right   Endo/Other  negative endocrine ROS  Renal/GU negative Renal ROS     Musculoskeletal negative musculoskeletal ROS (+)   Abdominal   Peds  Hematology negative hematology ROS (+)   Anesthesia Other Findings Day of surgery medications reviewed with the patient.  Reproductive/Obstetrics                            Anesthesia Physical Anesthesia Plan  ASA: II  Anesthesia Plan: General   Post-op Pain Management:  Regional for Post-op pain   Induction: Intravenous  PONV Risk Score and Plan: 4 or greater and Treatment may vary due to age or medical condition, Ondansetron and Dexamethasone  Airway Management Planned: Oral ETT  Additional Equipment:   Intra-op Plan:   Post-operative Plan: Extubation in OR  Informed Consent: I have reviewed the patients History and Physical, chart, labs and discussed the procedure including the risks, benefits and alternatives for the proposed anesthesia with the patient or authorized representative who has indicated his/her understanding and acceptance.   Dental advisory given  Plan Discussed with: CRNA  Anesthesia Plan Comments:         Anesthesia Quick Evaluation

## 2018-06-25 NOTE — Op Note (Signed)
Right inguinal hernia repair with mesh.    Indications: The patient presented with a bowel obstruction from an incarcerated right inguinal hernia.  The hernia was reduced.  His obstruction resolved.  The hernia popped out multiple other times and was difficult to reduce.  The patient desires repair in order to avoid emergent surgery from potential strangulation.    Pre-operative Diagnosis: right inguinal hernia with obstruction  Post-operative Diagnosis: right inguinal hernia, direct and indirect components  Surgeon: ZOXWRU,EAVWU   Anesthesia: General endotracheal anesthesia and TAPP block  ASA Class: 2  Procedure Details  The patient was seen again in the Holding Room. The risks, benefits, complications, treatment options, and expected outcomes were discussed with the patient. The possibilities of perforation of viscus, bleeding, infection, the need for additional procedures, and development of chronic pain or numbness were discussed with the patient and/or family. There was concurrence with the proposed plan, and informed consent was obtained. The site of surgery was properly noted/marked. The patient was taken to the Operating Room, and the procedure verified as right inguinal hernia repair. A Time Out was held and the above information confirmed.  The patient was placed in the supine position and underwent induction of anesthesia.  The lower abdomen and groin was prepped and draped in the standard fashion.  An obliquely oriented transverse incision was made. Dissection was carried through the soft tissue to expose the inguinal canal and inguinal ligament along its lower edge. The external oblique fascia was split along the course of its fibers, exposing the inguinal canal. The iliohypogastric nerve was superior to the superior edge of the external oblique, and this was retracted out of the way.  The ilioinguinal nerve was located underneath the external oblique, and it was sharply dissected away  from the adjacent structures.  This was tucked behind the inferior external oblique so that the mesh would not incorporate the nerve.    The cord and nerve were looped using a Penrose drain and reflected out of the field. The indirect hernia sac was dissected off the spermatic cord structures.  It was very small.  There was no sliding component to the hernia, and no intraabdominal organs were in the sac.  The sac was closed with a 3-0 silk suture and reduced into the abdomen.  The right sided Progrip mesh was selected.   This was secured to the pubic tubercle with a 2-0 Prolene suture.  The mesh was passed around the cord, and laid down flat underneath the external oblique.  The ilioinguinal nerve was laid on top of the mesh.   The entire area was anesthetized with Exparel.  The external oblique fashion was then closed in a continuous fashion using 2-0 Vicryl suture taking care not to cause entrapment.  The Scarpa's fascia was closed with a running 3-0 vicryl.  The skin was then closed with interrupted 3-0 vicryl deep dermal sutures and a running 4-0 Monocryl suture.  The skin was cleaned, dried, and dressed with Dermabond.

## 2018-06-26 ENCOUNTER — Encounter (HOSPITAL_COMMUNITY): Payer: Self-pay | Admitting: General Surgery

## 2018-06-26 MED ORDER — OXYCODONE HCL 5 MG PO TABS: 5.0000 mg | ORAL_TABLET | Freq: Four times a day (QID) | ORAL | 0 refills | Status: AC | PRN

## 2018-06-26 MED ORDER — OXYCODONE HCL 5 MG PO TABS: 5.0000 mg | ORAL_TABLET | Freq: Four times a day (QID) | ORAL | Status: DC | PRN

## 2018-06-26 MED ORDER — TRAMADOL HCL 50 MG PO TABS: 50.0000 mg | ORAL_TABLET | Freq: Four times a day (QID) | ORAL | 0 refills | Status: AC | PRN

## 2018-06-26 NOTE — Care Management Note (Signed)
Case Management Note  Patient Details  Name: Kenneth Barnett MRN: [id number] Date of Birth: 1941/04/28  Subjective/Objective:                  Discharge to home with self care.  Action/Plan: None at this time.  Expected Discharge Date:  06/26/18               Expected Discharge Plan:  Home/Self Care  In-House Referral:     Discharge planning Services  CM Consult  Post Acute Care Choice:    Choice offered to:     DME Arranged:    DME Agency:     HH Arranged:    HH Agency:     Status of Service:  Completed, signed off  If discussed at [employer] of Stay Meetings, dates discussed:    Additional Comments:  Golda Acre, RN 06/26/2018, 10:21 AM

## 2018-06-26 NOTE — Anesthesia Postprocedure Evaluation (Signed)
Anesthesia Post Note  Patient: Kenneth Barnett  Procedure(s) Performed: HERNIA REPAIR INGUINAL ADULT (Right Groin)     Patient location during evaluation: PACU Anesthesia Type: General Level of consciousness: awake and alert, oriented and awake Pain management: pain level controlled Vital Signs Assessment: post-procedure vital signs reviewed and stable Respiratory status: spontaneous breathing, nonlabored ventilation, respiratory function stable and patient connected to nasal cannula oxygen Cardiovascular status: blood pressure returned to baseline and stable Postop Assessment: no apparent nausea or vomiting Anesthetic complications: no    Last Vitals:  Vitals:   06/25/18 2030 06/26/18 0429  BP: (!) 131/59 137/66  Pulse: (!) 57 (!) 50  Resp: 18 18  Temp:  36.7 C  SpO2: 98% 98%    Last Pain:  Vitals:   06/26/18 1012  TempSrc:   PainSc: 0-No pain                 Cecile Hearing

## 2018-06-26 NOTE — Progress Notes (Signed)
Discharge instructions reviewed with patient. All questions answered. Patient ambulated to vehicle with belongings by nurse tech 

## 2018-06-26 NOTE — Discharge Summary (Signed)
[facility]/Trauma Discharge Summary   Patient ID: Kenneth Barnett MRN: [id number] DOB/AGE: 1941-07-01 77 y.o.  Admit date: 06/23/2018 Discharge date: 06/26/2018  Admitting Diagnosis: SBO  Discharge Diagnosis Patient Active Problem List   Diagnosis Date Noted  . Small bowel obstruction (HCC) 06/23/2018  - right inguinal hernia with obstruction  Consultants none  Imaging: No results found.  Procedures Dr. Donell Beers (06/25/18) right inguinal hernia repair with mesh  HPI: Patient is a 77 year old male with a history of enlarged prostate who presented to the emergency department with complaints of abdominal pain, nausea and vomiting. Patient states he is in town shopping for furniture. He does not live here. He states yesterday afternoon he was shopping with his wife and began having diaphoresis, abdominal pain, and nausea. He does not believe he had a bowel movement on Saturday or Sunday morning and he is usually regular every day. His abdominal pain was intermittent throughout the day, crampy in nature, severe at times, and at times radiated into his scrotum. Patient tried stool softener and a suppository and had a small BM last night. Pain progressively worsened which prompted him to come to the ER. Patient states he noticed swelling in his right scrotum last night which has since resolved. He does not recall having issues with scrotal swelling in the past. He thinks he may have had a hernia in high school/college. He has a hx of a right testicular cyst but has not had any issues with swelling prior to last night. Patient states 2 episodes of emesis. No additional flatus or BM since arrival to the hospital. Patient denies any previous abdominal surgeries. Patient is not anticoagulated.  Hospital Course:  Workup showed small bowel obstruction.  Patient was admitted and placed on the small bowel obstruction protocol. NGT was placed. Gastrografin was seen in colon on plain films  so NGT was removed and diet was advanced as tolerated. Pt's scrotal swelling returned and it was thought that a R inguinal hernia was the cause of his initial obstruction. Pt  underwent procedure listed above. Tolerated procedure well and was transferred to the floor.  Diet was advanced as tolerated.  On POD#1, the patient was voiding well, tolerating diet, ambulating well, pain well controlled, vital signs stable, incisions c/d/i and felt stable for discharge home.  Patient will follow up as outlined below and knows to call with questions or concerns.     Patient was discharged in good condition.  The [place] Substance controlled database was reviewed prior to prescribing narcotic pain medication to this patient.  Physical Exam: Gen: Alert, NAD, pleasant, cooperative Card: RRR, no M/G/R heard Pulm: CTA, no W/R/R, rate andeffort normal Abd: Soft, NT/ND, +BS, no HSM, incision in RLQ with glue intact is well appearing without drainage, surrounding erythema, or other signs of infection.  Skin: no rashes noted, warm and dry   Allergies as of 06/26/2018      Reactions   Penicillins Rash   Has patient had a PCN reaction causing immediate rash, facial/tongue/throat swelling, SOB or lightheadedness with hypotension: Yes Has patient had a PCN reaction causing severe rash involving mucus membranes or skin necrosis: No Has patient had a PCN reaction that required hospitalization: No Has patient had a PCN reaction occurring within the last 10 years: No If all of the above answers are "NO", then may proceed with Cephalosporin use.   Tape Rash   Paper tape is ok      Medication List    TAKE these  medications   dutasteride 0.5 MG capsule Commonly known as:  AVODART Take 0.5 mg by mouth daily.   GLUCOSAMINE-CHONDROITIN PO Take 1 tablet by mouth daily. Glucosamine 1500 mg-Chondroitin 1200 mg tablet   oxyCODONE 5 MG immediate release tablet Commonly known as:  Oxy IR/ROXICODONE Take 1  tablet (5 mg total) by mouth every 6 (six) hours as needed for severe pain.   PROBIOTIC PO Take 1 capsule by mouth daily.   tamsulosin 0.4 MG Caps capsule Commonly known as:  FLOMAX Take 0.4 mg by mouth daily.   traMADol 50 MG tablet Commonly known as:  ULTRAM Take 1 tablet (50 mg total) by mouth every 6 (six) hours as needed for moderate pain.   TraZODone HCl 150 MG Tb24 Take 150 mg by mouth at bedtime.   TURMERIC PO Place 500 mg under the tongue daily.   Vitamin B-12 1000 MCG Subl Place 1,000 mcg under the tongue daily.        Follow-up Information    Almond Lint, MD Follow up.   Specialty:  General Surgery Why:  You can call for follow up in 2-3 weeks with our office or you can follow up with your primary care doctor. Contact information: [address] [place] [address] [phone]           Signed: Joyce Copa [facility] 06/26/2018, 8:46 AM Pager: [phone] Consults: [phone] Mon-Fri 7:00 am-4:30 pm Sat-Sun 7:00 am-11:30 am

## 2019-11-27 IMAGING — CT CT ABD-PELV W/ CM
2 of 5 series · 16 of 46 positions shown, 18 images · IV contrast (APPLIED)
Comparison: None.

CLINICAL DATA: 76 y/o M; nausea, vomiting, bloating, constipation
for 1 day.

EXAM:
CT ABDOMEN AND PELVIS WITH CONTRAST
TECHNIQUE: Multidetector CT imaging of the abdomen and pelvis was performed
using the standard protocol following bolus administration of
intravenous contrast.
CONTRAST:  100mL EKEL72-000 IOPAMIDOL (EKEL72-000) INJECTION 61%

[Series 2: axial st · axial · 0.66mm/px · z∈[+878,+1253]mm · 13 of 85 slices shown, 15 images]
[im 5/85  soft-tissue]
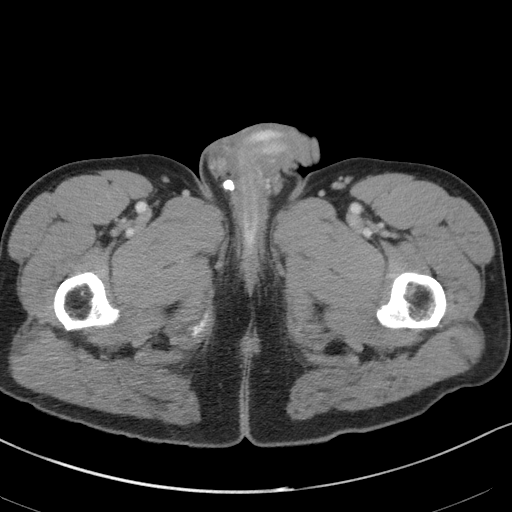
[im 5/85  bone]
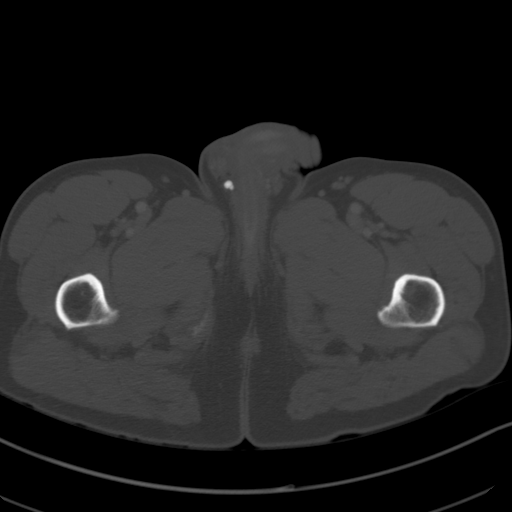
[im 14/85  soft-tissue]
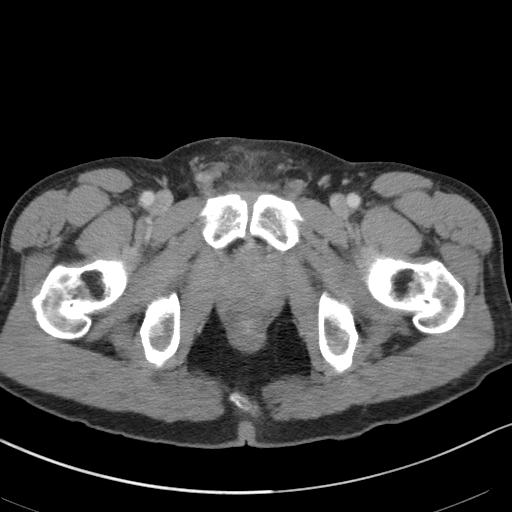
[im 18/85  soft-tissue]
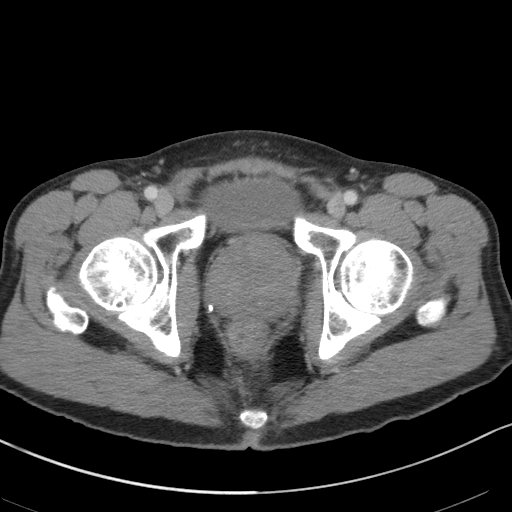
[im 23/85  soft-tissue]
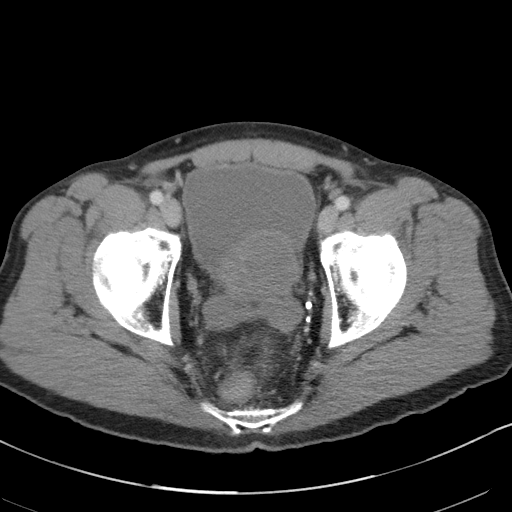
[im 31/85  soft-tissue]
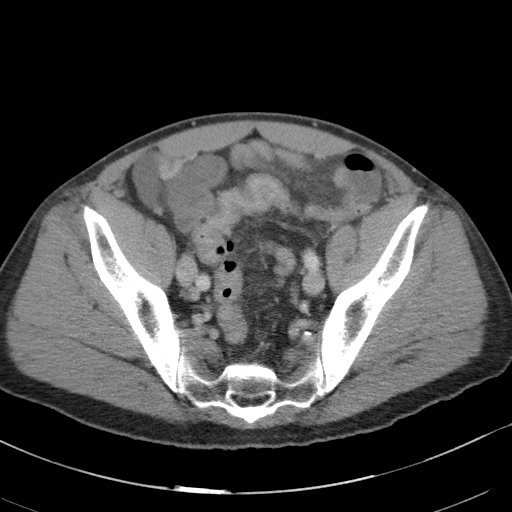
[im 36/85  soft-tissue]
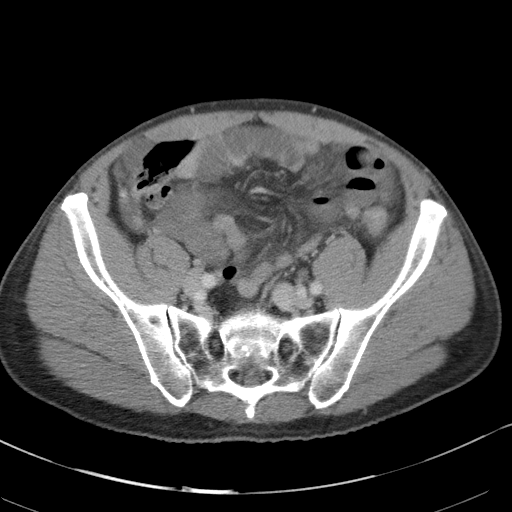
[im 45/85  soft-tissue]
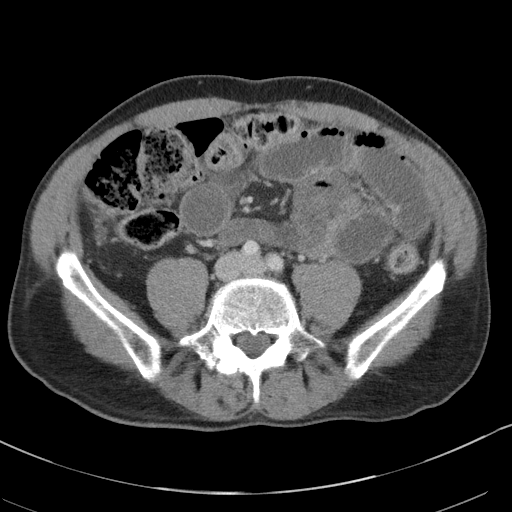
[im 49/85  soft-tissue]
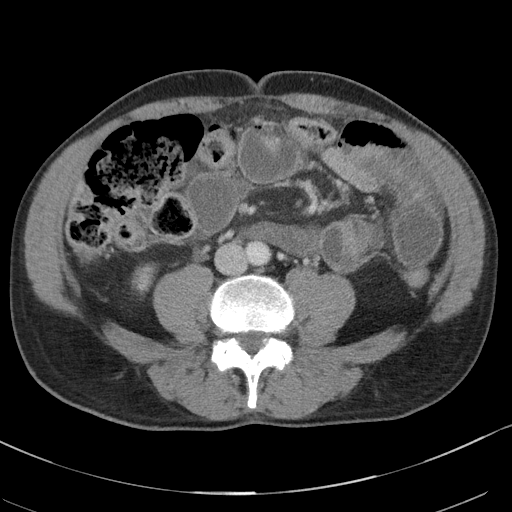
[im 54/85  soft-tissue]
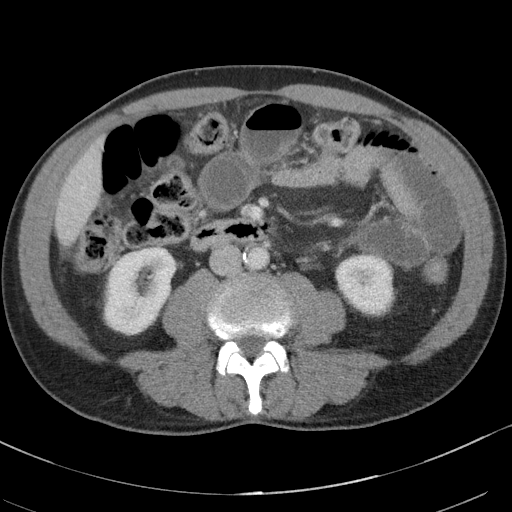
[im 54/85  bone]
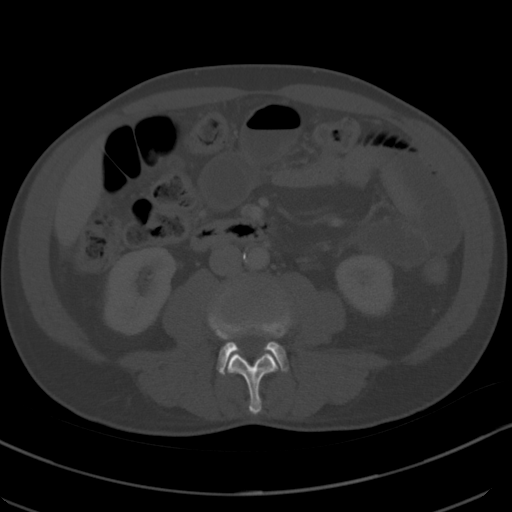
[im 62/85  soft-tissue]
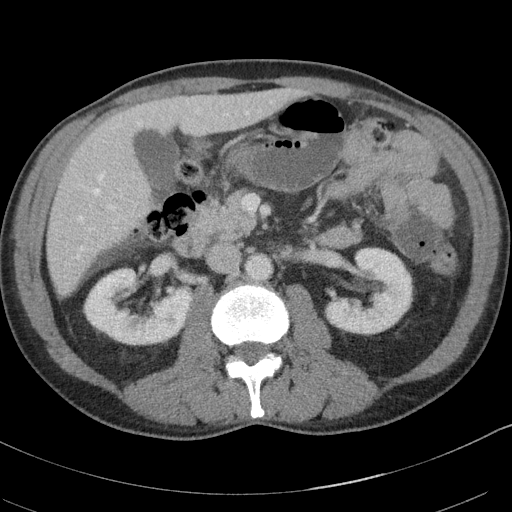
[im 67/85  soft-tissue]
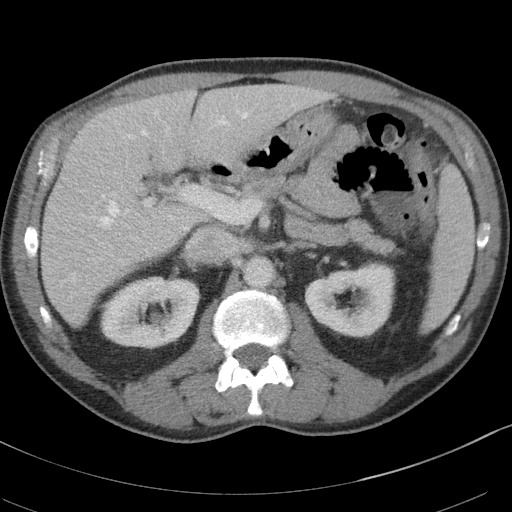
[im 71/85  soft-tissue]
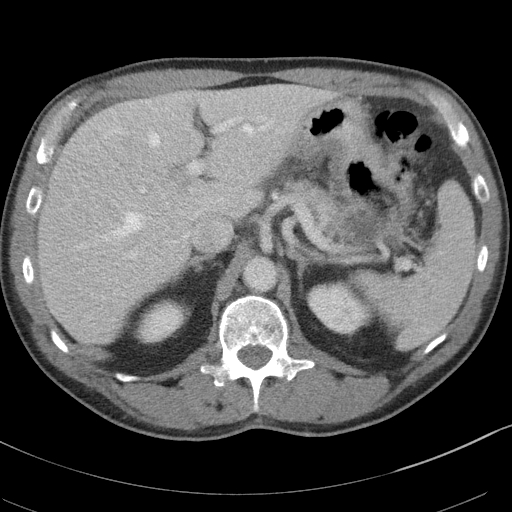
[im 80/85  soft-tissue]
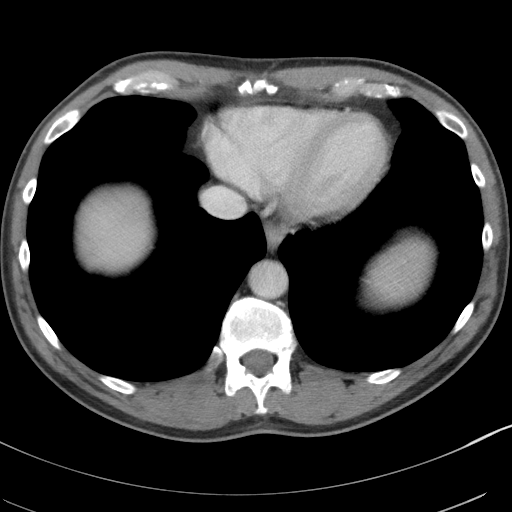

[Series 5: coronal st · coronal · 0.69mm/px · 3 of 98 slices shown]
[im 33/98  soft-tissue]
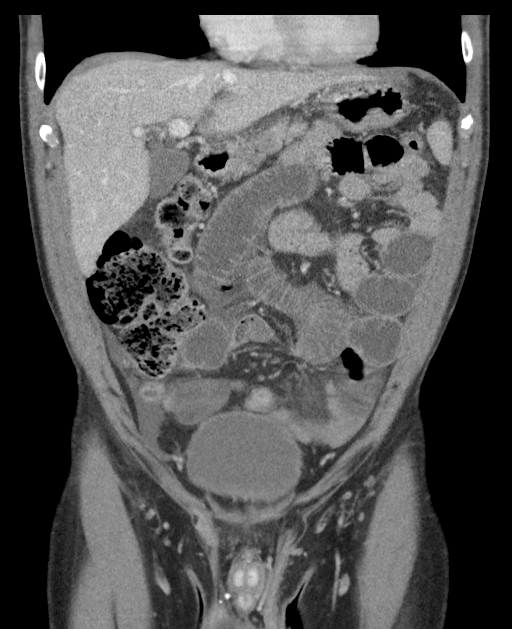
[im 44/98  soft-tissue]
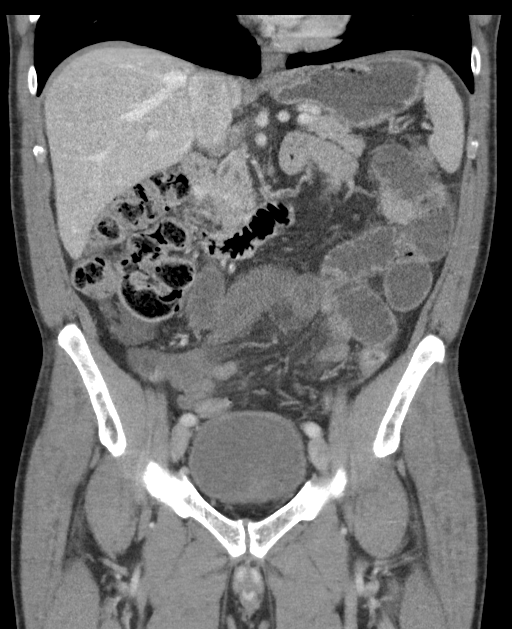
[im 54/98  soft-tissue]
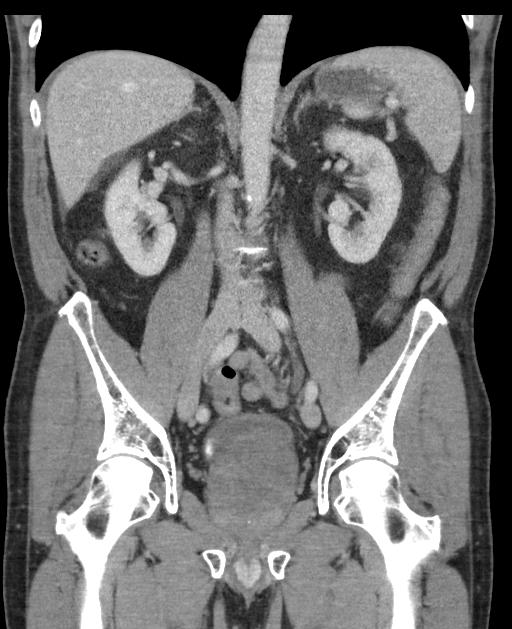

[16 of 46 positions shown; findings below may reference images not displayed]

FINDINGS: Lower chest: No acute abnormality.

Hepatobiliary: No focal liver abnormality is seen. No gallstones,
gallbladder wall thickening, or biliary dilatation.

Pancreas: Unremarkable. No pancreatic ductal dilatation or
surrounding inflammatory changes.

Spleen: Normal in size without focal abnormality.

Adrenals/Urinary Tract: Adrenal glands are unremarkable. Right
kidney interpolar subcentimeter cyst. Kidneys are otherwise normal,
without renal calculi, additional focal lesion, or hydronephrosis.
Right posterior bladder calcification may represent a wall
calcification or bladder stones.

Stomach/Bowel: Dilated loops of small bowel in the left hemiabdomen
a transition in the lower mid abdomen (series 2, image 47). Small
volume of ascites. No findings of perforation or abscess. Normal
appearance of the stomach. No inflammatory changes of the colon.
Normal appendix.

Vascular/Lymphatic: Aortic atherosclerosis. No enlarged abdominal or
pelvic lymph nodes.

Reproductive: Severe prostate enlargement measuring 5.7 x 6.5 x
cm (volume = 150 cm^3).

Other: No abdominal wall hernia or abnormality. No abdominopelvic
ascites.

Musculoskeletal: No fracture is seen. Lumbar spondylosis with L5-S1
loss of intervertebral disc space height and lower lumbar facet
arthrosis.
IMPRESSION: 1. Dilated loops of small bowel in the left hemiabdomen compatible
small bowel obstruction. Transition in bowel caliber in the lower
mid abdomen. No findings of perforation.
2. Small volume of ascites.
3.  Aortic Atherosclerosis ([id number]).
4. Severe prostate enlargement, proximally 150 cc.

By: Bouchon Lie M.D.

## 2019-11-28 IMAGING — DX DG ABDOMEN 1V
1 series · 1 of 1 positions shown · non-contrast
Comparison: Abdominal radiograph 06/23/2018

CLINICAL DATA: Patient with nausea and abdominal pain.

EXAM:
ABDOMEN - 1 VIEW

[abdomen kub]
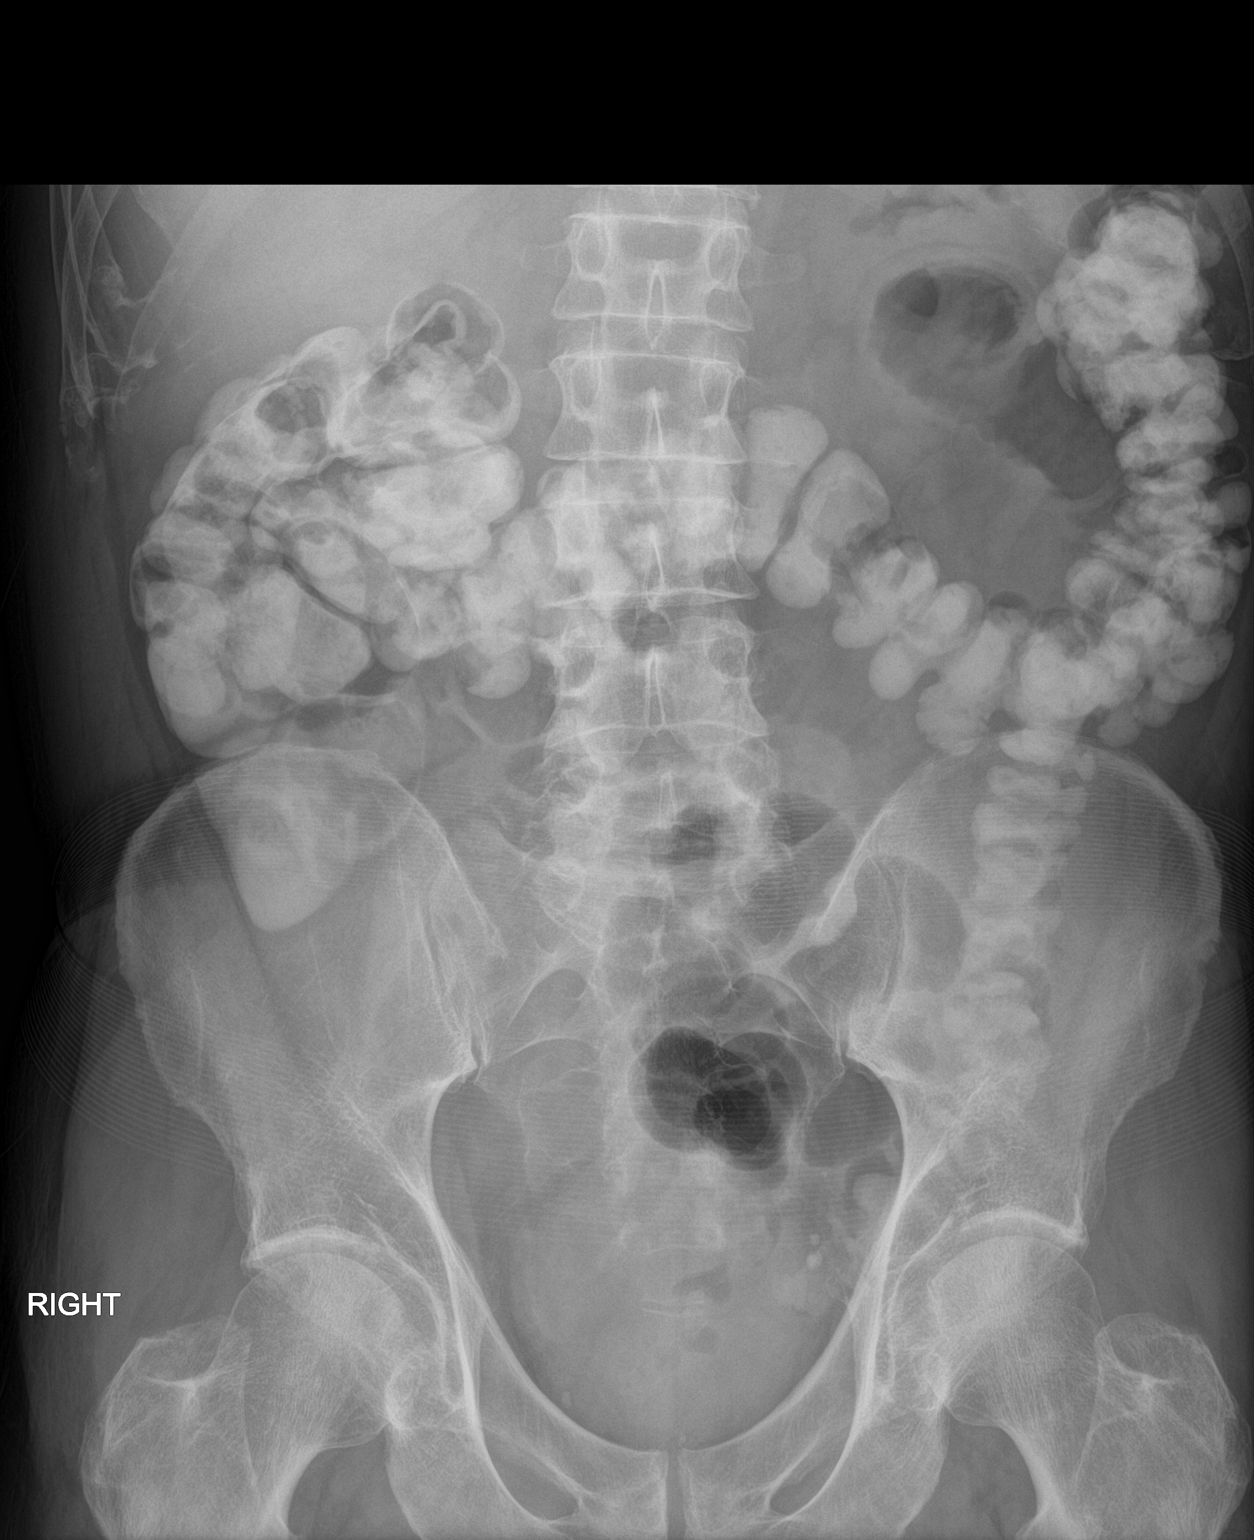

[1 of 1 positions shown; findings below may reference images not displayed]

FINDINGS: Enteric tube tip projects over the stomach, side-port projects at
the GE junction. Oral contrast material within the colon. Slight
interval improving gaseous distended loops of small bowel within the
abdomen. Pelvic phleboliths. Lumbar spine degenerative changes.
IMPRESSION: Enteric tube side-port projects at the GE junction, tip within the
stomach. Consider advancement.

Oral contrast material within the colon.
